# Patient Record
Sex: Male | Born: 1990 | Race: Black or African American | Hispanic: No | Marital: Single | Smoking: Current every day smoker
Health system: Southern US, Community
[De-identification: ages and names within clinical notes are randomized; demographics above are authoritative.]

---
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
## 2009-03-11 ENCOUNTER — Emergency Department (HOSPITAL_COMMUNITY): Admission: EM | Admit: 2009-03-11 | Discharge: 2009-03-11 | Payer: Self-pay | Admitting: Emergency Medicine

## 2012-03-09 ENCOUNTER — Emergency Department (HOSPITAL_COMMUNITY)
Admission: EM | Admit: 2012-03-09 | Discharge: 2012-03-09 | Disposition: A | Discharge status: Home / Self Care | Payer: Self-pay | Attending: Emergency Medicine | Admitting: Emergency Medicine

## 2012-03-09 ENCOUNTER — Encounter (HOSPITAL_COMMUNITY): Payer: Self-pay | Admitting: Emergency Medicine

## 2012-03-09 DIAGNOSIS — L42 Pityriasis rosea: Secondary | ICD-10-CM | POA: Insufficient documentation

## 2012-03-09 DIAGNOSIS — F17200 Nicotine dependence, unspecified, uncomplicated: Secondary | ICD-10-CM | POA: Insufficient documentation

## 2012-03-09 NOTE — ED Notes (Signed)
Pt not in room when RN entered to d/c. EDPA had discussed d/c instructions with pt. Unable to obtain d/c signature. No Rx written for pt.

## 2012-03-09 NOTE — ED Provider Notes (Signed)
History     CSN: [id number]  Arrival date & time 03/09/12  1450   First MD Initiated Contact with Patient 03/09/12 1511      Chief Complaint  Patient presents with  . Rash    (Consider location/radiation/quality/duration/timing/severity/associated sxs/prior treatment) Patient is a 21 y.o. male presenting with rash. The history is provided by the patient.  Rash  This is a new problem. Episode onset: 2 weeks ago. The problem has been gradually worsening. The problem is associated with nothing. There has been no fever. The rash is present on the torso. The patient is experiencing no pain. Associated symptoms include itching. Pertinent negatives include no blisters and no weeping. He has tried antihistamines for the symptoms. The treatment provided mild relief.  Close personal contacts do not have a similar rash.   No past medical history on file.  No past surgical history on file.  No family history on file.  History  Substance Use Topics  . Smoking status: Current Everyday Smoker -- 0.5 packs/day for 5 years    Types: Cigarettes  . Smokeless tobacco: Not on file  . Alcohol Use: Yes     weekends      Review of Systems  Constitutional: Negative for fever and chills.  HENT: Negative for neck stiffness.   Respiratory: Negative for shortness of breath.   Gastrointestinal: Negative for nausea, vomiting and abdominal pain.  Genitourinary: Negative for discharge, genital sores and penile pain.  Musculoskeletal: Negative for joint swelling.  Skin: Positive for itching and rash.  Neurological: Negative for headaches.  Psychiatric/Behavioral: Negative for confusion.    Allergies  Review of patient's allergies indicates no known allergies.  Home Medications   Current Outpatient Rx  Name Route Sig Dispense Refill  . IBUPROFEN 200 MG PO TABS Oral Take 400 mg by mouth every 6 (six) hours as needed. pain      BP 132/77  Pulse 72  Temp(Src) 98.4 F (36.9 C) (Oral)  Resp 16   SpO2 100%  Physical Exam  Nursing note and vitals reviewed. Constitutional: He is oriented to person, place, and time. He appears well-developed and well-nourished. No distress.  HENT:  Head: Normocephalic and atraumatic.  Right Ear: External ear normal.  Left Ear: External ear normal.  Eyes: Pupils are equal, round, and reactive to light.  Neck: Normal range of motion. Neck supple.  Cardiovascular: Normal rate and regular rhythm.   Pulmonary/Chest: Effort normal. No respiratory distress.  Abdominal: Soft. He exhibits no distension. There is no tenderness.  Musculoskeletal: He exhibits no edema and no tenderness.  Neurological: He is alert and oriented to person, place, and time.  Skin: Skin is warm and dry. Rash noted.       Macular, erythematous, ovoid rash with central clearing and slight flaking to edges present to trunk and proximally on all extremities.  Psychiatric: He has a normal mood and affect.    ED Course  Procedures (including critical care time)  Labs Reviewed - No data to display No results found.   Dx 1: Pityriasis rosea   MDM  Rash to trunk, appearance and pt history not c/w secondary syphilis, guttate psoriasis, tinea corporis, tinea versicolor, nummular eczema. Although no specific herald patch can be seen, pt presentation most consistent with pityriasis rosea. Discussed symptomatic tx and UV therapy. Advised dermatology f/u if not improving within 2-3 months or if systemic s/s develop or rash worsens.        Shaaron Adler, [place] 03/09/12 1544

## 2012-03-09 NOTE — Discharge Instructions (Signed)
Use benadryl or zyrtec (which should not make you as drowsy as benadryl) for your itching. You can try sunlight to help the rash improve more quickly. If it does not get better in the time frame we discussed, or if it gets worse or you develop associated fever you should be seen again.    Pityriasis Rosea Pityriasis rosea is a rash which is probably caused by a virus. It generally starts as a scaly, red patch on the trunk (the area of the body that a t-shirt would cover) but does not appear on sun exposed areas. The rash is usually preceded by an initial larger spot called the "herald patch" a week or more before the rest of the rash appears. Generally within one to two days the rash appears rapidly on the trunk, upper arms, and sometimes the upper legs. The rash usually appears as flat, oval patches of scaly pink color. The rash can also be raised and one is able to feel it with a finger. The rash can also be finely crinkled and may slough off leaving a ring of scale around the spot. Sometimes a mild sore throat is present with the rash. It usually affects children and young adults in the spring and autumn. Women are more frequently affected than men. TREATMENT  Pityriasis rosea is a self-limited condition. This means it goes away within 4 to 8 weeks without treatment. The spots may persist for several months, especially in darker-colored skin after the rash has resolved and healed. Benadryl and steroid creams may be used if itching is a problem. SEEK MEDICAL CARE IF:   Your rash does not go away or persists longer than three months.   You develop fever and joint pain.   You develop severe headache and confusion.   You develop breathing difficulty, vomiting and/or extreme weakness.  Document Released: 12/28/2001 Document Revised: 11/10/2011 Document Reviewed: 01/16/2009 [facility] Patient Information 2012 [place], [place].

## 2012-03-09 NOTE — ED Notes (Signed)
Started 2 weeks ago with rash on right flank area. Now has spread all over trunk and starting on arms. States it itches only occasionally.

## 2012-03-10 NOTE — ED Provider Notes (Signed)
Medical screening examination/treatment/procedure(s) were performed by non-physician practitioner and as supervising physician I was immediately available for consultation/collaboration.  Doug Sou, MD 03/10/12 [id number]

## 2014-08-06 ENCOUNTER — Emergency Department (HOSPITAL_COMMUNITY): Payer: Worker's Compensation

## 2014-08-06 ENCOUNTER — Encounter (HOSPITAL_COMMUNITY): Payer: Self-pay | Admitting: Emergency Medicine

## 2014-08-06 ENCOUNTER — Emergency Department (HOSPITAL_COMMUNITY)
Admission: EM | Admit: 2014-08-06 | Discharge: 2014-08-06 | Disposition: A | Discharge status: Home / Self Care | Payer: Worker's Compensation | Attending: Emergency Medicine | Admitting: Emergency Medicine

## 2014-08-06 DIAGNOSIS — F17200 Nicotine dependence, unspecified, uncomplicated: Secondary | ICD-10-CM | POA: Insufficient documentation

## 2014-08-06 DIAGNOSIS — S61412A Laceration without foreign body of left hand, initial encounter: Secondary | ICD-10-CM

## 2014-08-06 DIAGNOSIS — S61209A Unspecified open wound of unspecified finger without damage to nail, initial encounter: Secondary | ICD-10-CM | POA: Insufficient documentation

## 2014-08-06 DIAGNOSIS — Y9389 Activity, other specified: Secondary | ICD-10-CM | POA: Insufficient documentation

## 2014-08-06 DIAGNOSIS — W278XXA Contact with other nonpowered hand tool, initial encounter: Secondary | ICD-10-CM | POA: Insufficient documentation

## 2014-08-06 DIAGNOSIS — Z23 Encounter for immunization: Secondary | ICD-10-CM | POA: Insufficient documentation

## 2014-08-06 DIAGNOSIS — S66922A Laceration of unspecified muscle, fascia and tendon at wrist and hand level, left hand, initial encounter: Secondary | ICD-10-CM

## 2014-08-06 DIAGNOSIS — S61227A Laceration with foreign body of left little finger without damage to nail, initial encounter: Secondary | ICD-10-CM

## 2014-08-06 DIAGNOSIS — Y9289 Other specified places as the place of occurrence of the external cause: Secondary | ICD-10-CM | POA: Insufficient documentation

## 2014-08-06 DIAGNOSIS — S65517A Laceration of blood vessel of left little finger, initial encounter: Secondary | ICD-10-CM

## 2014-08-06 DIAGNOSIS — S61219A Laceration without foreign body of unspecified finger without damage to nail, initial encounter: Secondary | ICD-10-CM

## 2014-08-06 MED ORDER — NAPROXEN 500 MG PO TABS: 500.0000 mg | ORAL_TABLET | Freq: Two times a day (BID) | ORAL | Status: AC | PRN

## 2014-08-06 MED ORDER — LIDOCAINE HCL 2 % IJ SOLN
10.0000 mL | Freq: Once | INTRAMUSCULAR | Status: AC
  Administered 2014-08-06: 200 mg
  Filled 2014-08-06: qty 20

## 2014-08-06 MED ORDER — OXYCODONE-ACETAMINOPHEN 5-325 MG PO TABS
1.0000 | ORAL_TABLET | Freq: Once | ORAL | Status: AC
  Administered 2014-08-06: 1 via ORAL
  Filled 2014-08-06: qty 1

## 2014-08-06 MED ORDER — TETANUS-DIPHTH-ACELL PERTUSSIS 5-2.5-18.5 LF-MCG/0.5 IM SUSP
0.5000 mL | Freq: Once | INTRAMUSCULAR | Status: AC
  Administered 2014-08-06: 0.5 mL via INTRAMUSCULAR
  Filled 2014-08-06: qty 0.5

## 2014-08-06 MED ORDER — OXYCODONE-ACETAMINOPHEN 5-325 MG PO TABS: 1.0000 | ORAL_TABLET | Freq: Four times a day (QID) | ORAL | Status: DC | PRN

## 2014-08-06 MED ORDER — AMOXICILLIN 500 MG PO TABS: 500.0000 mg | ORAL_TABLET | Freq: Two times a day (BID) | ORAL | Status: AC

## 2014-08-06 NOTE — ED Provider Notes (Signed)
CSN: [id number]     Arrival date & time 08/06/14  1409 History  This chart was scribed for non-physician practitioner, Allen Derry, PA-C working with Rolan Bucco, MD by Greggory Stallion, ED scribe. This patient was seen in room WTR7/WTR7 and the patient's care was started at 3:48 PM.   Chief Complaint  Patient presents with  . Extremity Laceration   Patient is a 23 y.o. male presenting with skin laceration. The history is provided by the patient. No language interpreter was used.  Laceration Location:  Hand Hand laceration location:  L hand and L finger Length (cm):  1.5 Depth:  Through underlying tissue Quality: jagged   Bleeding: arterial and controlled with pressure   Time since incident:  2 hours Laceration mechanism:  Metal edge Pain details:    Quality:  Burning (pinching)   Severity:  Severe   Timing:  Constant   Progression:  Unchanged Foreign body present:  Metal Relieved by:  None tried Worsened by:  Nothing tried Ineffective treatments:  None tried Tetanus status:  Out of date  HPI Comments: COWAN PILAR is a 23 y.o. male who presents to the Emergency Department complaining of left pinky finger laceration that occurred around 1:30 PM today. Pt states that his hand slipped while using a band saw to cut metal at work and accidentally cut himself. Reports that blood started squirting out but has been controlled since with pressure. Rates pain 10/10 and states he has numbness around the fingertip. Describes pain as burning and pinching. Worse with movement, with no alleviating factors, didn't take anything for the pain. Denies light headedness, dizziness, tingling in fingers, loss of ROM. Denies history of blood disorders and is not currently taking any blood thinners. Pt's tetanus is out of date.   History reviewed. No pertinent past medical history. History reviewed. No pertinent past surgical history. History reviewed. No pertinent family history. History   Substance Use Topics  . Smoking status: Current Every Day Smoker -- 0.50 packs/day for 5 years    Types: Cigarettes  . Smokeless tobacco: Not on file  . Alcohol Use: Yes     Comment: weekends     Review of Systems  Musculoskeletal: Positive for myalgias (L pinky). Negative for arthralgias and joint swelling.  Skin: Positive for wound.  Neurological: Positive for numbness (L pinky tip). Negative for dizziness and light-headedness.  Hematological: Does not bruise/bleed easily.  10 Systems reviewed and are negative for acute change except as noted in the HPI.  Allergies  Review of patient's allergies indicates no known allergies.  Home Medications   Prior to Admission medications   Medication Sig Start Date End Date Taking? Authorizing Provider  amoxicillin (AMOXIL) 500 MG tablet Take 1 tablet (500 mg total) by mouth 2 (two) times daily. 08/06/14   Keneth Borg Strupp Camprubi-Soms, PA-C  naproxen (NAPROSYN) 500 MG tablet Take 1 tablet (500 mg total) by mouth 2 (two) times daily as needed for mild pain, moderate pain or headache (TAKE WITH MEALS.). 08/06/14   Donnita Falls Camprubi-Soms, PA-C  oxyCODONE-acetaminophen (PERCOCET) 5-325 MG per tablet Take 1-2 tablets by mouth every 6 (six) hours as needed for severe pain. 08/06/14   Jamita Mckelvin Strupp Camprubi-Soms, PA-C   BP 126/79  Pulse 60  Temp(Src) 98.5 F (36.9 C) (Oral)  Resp 18  SpO2 100%  Physical Exam  Nursing note and vitals reviewed. Constitutional: He is oriented to person, place, and time. Vital signs are normal. He appears well-developed and well-nourished. No distress.  VSS, visibly uncomfortable but pleasant and cooperative  HENT:  Head: Normocephalic and atraumatic.  Mouth/Throat: Mucous membranes are normal.  Eyes: Conjunctivae and EOM are normal.  Neck: Normal range of motion. Neck supple.  Cardiovascular: Normal rate and intact distal pulses.   Intact distal pulses, arterial bleeding noted to L pinky laceration which  is controlled with pressure. Fingertip with good cap refill  Pulmonary/Chest: Effort normal. No respiratory distress.  Abdominal: Normal appearance. He exhibits no distension.  Musculoskeletal:       Left hand: He exhibits decreased range of motion (secondary to pain and some loss of DIP flexion), tenderness, disruption of two-point discrimination (at L pinky tip) and laceration. He exhibits no bony tenderness, normal capillary refill, no deformity and no swelling. Decreased sensation (L pinky tip) noted. Normal strength noted.       Hands: L pinky with laceration across middle phalanx with arterial bleeding noted, controlled with pressure. ROM limited at DIP joint with flexion, but full extension noted. Loss of 2point discrimination at fingertip. TTP to area of lac. Cap refill intact. Strength in L wrist/hand intact.   Neurological: He is alert and oriented to person, place, and time. He has normal strength. A sensory deficit (as above) is present.  Skin: Skin is warm and dry. Laceration noted.  1.5cm jagged lac to L pinky in middle phalanx segment, bleeding from skin edge, arterial, but controlled with pressure.   Psychiatric: He has a normal mood and affect. His behavior is normal.    ED Course  LACERATION REPAIR Date/Time: 08/06/2014 4:16 PM Performed by: Marjean Donna, Fallon Haecker STRUPP Authorized by: Ramond Marrow Consent: Verbal consent obtained. Risks and benefits: risks, benefits and alternatives were discussed Consent given by: patient Patient understanding: patient states understanding of the procedure being performed Patient consent: the patient's understanding of the procedure matches consent given Patient identity confirmed: verbally with patient Body area: upper extremity Location details: left small finger Laceration length: 1.5 cm Contamination: The wound is contaminated. Foreign bodies: metal Tendon involvement: complex Nerve involvement: complex Vascular  damage: yes Anesthesia: local infiltration Local anesthetic: lidocaine 2% without epinephrine Anesthetic total: 1.5 ml Patient sedated: no Preparation: Patient was prepped and draped in the usual sterile fashion. Irrigation solution: saline Irrigation method: syringe Amount of cleaning: extensive Debridement: none Degree of undermining: none Skin closure: 5-0 Prolene Number of sutures: 2 Technique: simple Approximation: close Approximation difficulty: complex Dressing: 4x4 sterile gauze Patient tolerance: Patient tolerated the procedure well with no immediate complications. Comments: Controlled arterial bleeding but oozing still from edges. FB attempted to be removed, sutured closed to re-xray and see if it was removed. Did not fully close wound at this time  FOREIGN BODY REMOVAL Date/Time: 08/06/2014 5:45 PM Performed by: Marjean Donna, Akylah Hascall STRUPP Authorized by: Ramond Marrow Consent: Verbal consent obtained. Risks and benefits: risks, benefits and alternatives were discussed Consent given by: patient Patient understanding: patient states understanding of the procedure being performed Patient consent: the patient's understanding of the procedure matches consent given Patient identity confirmed: verbally with patient Body area: skin General location: upper extremity Location details: left small finger Anesthesia method: anesthesia as previously. Patient sedated: no Patient restrained: no Patient cooperative: yes Localization method: probed Removal mechanism: forceps and irrigation Dressing: dressing applied Tendon involvement: complex Depth: deep Complexity: complex 0 objects recovered. Post-procedure assessment: foreign body not removed Patient tolerance: Patient tolerated the procedure well with no immediate complications. Comments: Attempted to irrigate and locate FB, unable to visualize. Noted tendon involvement. LACERATION REPAIR  Date/Time:  08/06/2014 6:45 PM Performed by: Marjean Donna, Hendrick Pavich STRUPP Authorized by: Ramond Marrow Consent: Verbal consent obtained. Risks and benefits: risks, benefits and alternatives were discussed Consent given by: patient Patient understanding: patient states understanding of the procedure being performed Patient consent: the patient's understanding of the procedure matches consent given Patient identity confirmed: verbally with patient Body area: upper extremity Location details: left small finger Laceration length: 1.5 cm Contamination: The wound is contaminated. Foreign bodies: metal Tendon involvement: complex Nerve involvement: complex Vascular damage: yes Anesthesia method: as before. Patient sedated: no Preparation: Patient was prepped and draped in the usual sterile fashion. Irrigation solution: saline Irrigation method: syringe Amount of cleaning: extensive Debridement: none Degree of undermining: none Skin closure: 5-0 Prolene Number of sutures: 2 Technique: simple Approximation: close Approximation difficulty: simple Dressing: pressure dressing and splint Patient tolerance: Patient tolerated the procedure well with no immediate complications. Comments: Sutured closed to with FB retained but arterial bleeding controlled after wound seal and compression. Will f/up with hand surgeon for further repair    DIAGNOSTIC STUDIES: Oxygen Saturation is 100% on RA, normal by my interpretation.    COORDINATION OF CARE: 3:51 PM-Discussed treatment plan which includes percocet, laceration repair and updating tetanus with pt at bedside and pt agreed to plan.   5:54 PM-Tried to get other foreign body out of finger and was unsuccessful. After further exploration of wound, there was tendon involvement. Will consult hand.   6:45 PM- sutured closed in order to have hand surgeon operate at a later date.   Labs Review Labs Reviewed - No data to display  Imaging  Review Dg Finger Little Left  08/06/2014   CLINICAL DATA:  Recent laceration  EXAM: LEFT LITTLE FINGER 2+V  COMPARISON:  Film from earlier in the same day  FINDINGS: The previously seen dressings have been removed. Again, no bony abnormality is seen. There remain multiple small radiopaque densities within the soft tissues consistent with foreign bodies.  IMPRESSION: Multiple retained foreign bodies.  No bony abnormality is noted.   Electronically Signed   By: Alcide Clever M.D.   On: 08/06/2014 17:20   Dg Finger Little Left  08/06/2014   CLINICAL DATA:  Recent laceration  EXAM: LEFT LITTLE FINGER 2+V  COMPARISON:  None.  FINDINGS: Considerable soft tissue irregularity is noted related to overlying dressings as well as the recent injury. Multiple small radiopaque foreign bodies are noted within the soft tissues likely related to the recent injury. No definitive bony abnormality is seen.  IMPRESSION: Soft tissue injury with apparent foreign bodies. No bony abnormality is seen.   Electronically Signed   By: Alcide Clever M.D.   On: 08/06/2014 14:52     EKG Interpretation None      MDM   Final diagnoses:  Laceration of left little finger with foreign body w/o damage to nail, initial encounter  Laceration of blood vessel of left little finger, initial encounter  Laceration of hand with tendon involvement including fingers, left, initial encounter    23y/o male with L pinky lac. Arterial bleeding controlled after pressure and wound seal. Neurovasc damage as well as tendon damage noted. Xray with +FB, FB could not be recovered after several attempts, re-imaging demonstrated continued retained FB. Bleeding controlled after pressure and wound seal, although would re-bleed with removal of pressure. Hand surgeon Dr. Melvyn Novas consulted and visualized wound with Haiku image. Stated to suture area closed, with FB still retained, as well as with tendons unrepaired, and place pressure  dressing with splint. Will see  him on Friday. Discussed this with pt, who agreed. Repaired with 2 sutures and bleeding continued to ooze but no longer squirting. Pt stable throughout stay in the ED. Given pain meds x2 with good results. rx given for pain meds, and amoxicillin to help prevent infection. Would've liked to give Augmentin but due to cost concern, pt stated he'd rather go with amox although he understood the risks. Will be seeing Dr Melvyn Novas Friday in the office. Tetanus updated. I explained the diagnosis and have given explicit precautions to return to the ER including for any other new or worsening symptoms. The patient understands and accepts the medical plan as it's been dictated and I have answered their questions. Discharge instructions concerning home care and prescriptions have been given. The patient is STABLE and is discharged to home in good condition.   I personally performed the services described in this documentation, which was scribed in my presence. The recorded information has been reviewed and is accurate.  BP 132/84  Pulse 61  Temp(Src) 98.5 F (36.9 C) (Oral)  Resp 16  SpO2 98%  Meds ordered this encounter  Medications  . Tdap (BOOSTRIX) injection 0.5 mL    Sig:   . oxyCODONE-acetaminophen (PERCOCET/ROXICET) 5-325 MG per tablet 1 tablet    Sig:   . lidocaine (XYLOCAINE) 2 % (with pres) injection 200 mg    Sig:   . oxyCODONE-acetaminophen (PERCOCET/ROXICET) 5-325 MG per tablet 1 tablet    Sig:   . amoxicillin (AMOXIL) 500 MG tablet    Sig: Take 1 tablet (500 mg total) by mouth 2 (two) times daily.    Dispense:  14 tablet    Refill:  0    Order Specific Question:  Supervising Provider    Answer:  Eber Hong D [[id number]]  . oxyCODONE-acetaminophen (PERCOCET) 5-325 MG per tablet    Sig: Take 1-2 tablets by mouth every 6 (six) hours as needed for severe pain.    Dispense:  20 tablet    Refill:  0    Order Specific Question:  Supervising Provider    Answer:  Eber Hong D [[id number]]  .  naproxen (NAPROSYN) 500 MG tablet    Sig: Take 1 tablet (500 mg total) by mouth 2 (two) times daily as needed for mild pain, moderate pain or headache (TAKE WITH MEALS.).    Dispense:  20 tablet    Refill:  0    Order Specific Question:  Supervising Provider    Answer:  Vida Roller [address] [place] Latissa Frick Camprubi-Soms, PA-C 08/06/14 2028

## 2014-08-06 NOTE — ED Provider Notes (Signed)
     Rolan Bucco, MD 08/06/14 [phone]

## 2014-08-06 NOTE — Discharge Instructions (Signed)
Use the splint at all times aside from when you're doing the dressing changes twice daily. Wash your wound with soap and water twice daily and rebandage with compression if bleeding, and place the splint back on. Take antibiotic as directed. Use naprosyn and percocet as needed for pain, don't drive or operate machinery while taking this. See Dr. Melvyn Novas on Friday, call his office tomorrow morning and make the appointment. Return to the ER for changes or worsening symptoms.  . Laceration Care, Adult A laceration is a cut that goes through all layers of the skin. The cut goes into the tissue beneath the skin. HOME CARE For stitches (sutures) or staples:  Keep the cut clean and dry.  If you have a bandage (dressing), change it at least once a day. Change the bandage if it gets wet or dirty, or as told by your doctor.  Wash the cut with soap and water 2 times a day. Rinse the cut with water. Pat it dry with a clean towel.  Put a thin layer of medicated cream on the cut as told by your doctor.  You may shower after the first 24 hours. Do not soak the cut in water until the stitches are removed.  Only take medicines as told by your doctor.  Have your stitches or staples removed as told by your doctor. For skin adhesive strips:  Keep the cut clean and dry.  Do not get the strips wet. You may take a bath, but be careful to keep the cut dry.  If the cut gets wet, pat it dry with a clean towel.  The strips will fall off on their own. Do not remove the strips that are still stuck to the cut. For wound glue:  You may shower or take baths. Do not soak or scrub the cut. Do not swim. Avoid heavy sweating until the glue falls off on its own. After a shower or bath, pat the cut dry with a clean towel.  Do not put medicine on your cut until the glue falls off.  If you have a bandage, do not put tape over the glue.  Avoid lots of sunlight or tanning lamps until the glue falls off. Put sunscreen on  the cut for the first year to reduce your scar.  The glue will fall off on its own. Do not pick at the glue. You may need a tetanus shot if:  You cannot remember when you had your last tetanus shot.  You have never had a tetanus shot. If you need a tetanus shot and you choose not to have one, you may get tetanus. Sickness from tetanus can be serious. GET HELP RIGHT AWAY IF:   Your pain does not get better with medicine.  Your arm, hand, leg, or foot loses feeling (numbness) or changes color.  Your cut is bleeding.  Your joint feels weak, or you cannot use your joint.  You have painful lumps on your body.  Your cut is red, puffy (swollen), or painful.  You have a red line on the skin near the cut.  You have yellowish-white fluid (pus) coming from the cut.  You have a fever.  You have a bad smell coming from the cut or bandage.  Your cut breaks open before or after stitches are removed.  You notice something coming out of the cut, such as wood or glass.  You cannot move a finger or toe. MAKE SURE YOU:   Understand these instructions.  Will watch your condition.  Will get help right away if you are not doing well or get worse. Document Released: 05/09/2008 Document Revised: 02/13/2012 Document Reviewed: 05/17/2011 [facility] Patient Information 2015 [place], [place]. This information is not intended to replace advice given to you by your health care provider. Make sure you discuss any questions you have with your health care provider.

## 2014-08-06 NOTE — ED Notes (Signed)
Pt states that he cut his finger on a band saw 1 hr ago. Lt pinky finger.  Bleeding controlled.

## 2014-08-07 NOTE — ED Provider Notes (Signed)
Medical screening examination/treatment/procedure(s) were performed by non-physician practitioner and as supervising physician I was immediately available for consultation/collaboration.   EKG Interpretation None        Rolan Bucco, MD 08/07/14 [phone]

## 2014-08-12 MED ORDER — CEFAZOLIN SODIUM-DEXTROSE 2-3 GM-% IV SOLR
2.0000 g | INTRAVENOUS | Status: AC
  Administered 2014-08-13: 2 g via INTRAVENOUS
  Filled 2014-08-12: qty 50

## 2014-08-12 NOTE — Progress Notes (Signed)
No pre-op orders in EPIC, called Dr. Ortmann's office and left message requesting orders. 

## 2014-08-12 NOTE — Progress Notes (Signed)
Unable to reach pt by phone. Contacted his sister, Ramond Craver, she was at work and unable to talk. Called her back and left pre-op instructions on her VM and she will relay them to her brother.

## 2014-08-13 ENCOUNTER — Ambulatory Visit (HOSPITAL_COMMUNITY): Payer: Worker's Compensation | Admitting: Anesthesiology

## 2014-08-13 ENCOUNTER — Encounter (HOSPITAL_COMMUNITY): Payer: Self-pay | Admitting: *Deleted

## 2014-08-13 ENCOUNTER — Encounter (HOSPITAL_COMMUNITY)
Admission: RE | Disposition: A | Discharge status: Home / Self Care | Payer: Self-pay | Source: Ambulatory Visit | Attending: Orthopedic Surgery

## 2014-08-13 ENCOUNTER — Ambulatory Visit (HOSPITAL_COMMUNITY)
Admission: RE | Admit: 2014-08-13 | Discharge: 2014-08-13 | Disposition: A | Discharge status: Home / Self Care | Payer: Worker's Compensation | Source: Ambulatory Visit | Attending: Orthopedic Surgery | Admitting: Orthopedic Surgery

## 2014-08-13 ENCOUNTER — Encounter (HOSPITAL_COMMUNITY): Payer: Worker's Compensation | Admitting: Anesthesiology

## 2014-08-13 DIAGNOSIS — T148XXA Other injury of unspecified body region, initial encounter: Secondary | ICD-10-CM | POA: Diagnosis not present

## 2014-08-13 DIAGNOSIS — X58XXXA Exposure to other specified factors, initial encounter: Secondary | ICD-10-CM | POA: Diagnosis not present

## 2014-08-13 DIAGNOSIS — F17200 Nicotine dependence, unspecified, uncomplicated: Secondary | ICD-10-CM | POA: Insufficient documentation

## 2014-08-13 DIAGNOSIS — S61209A Unspecified open wound of unspecified finger without damage to nail, initial encounter: Secondary | ICD-10-CM | POA: Insufficient documentation

## 2014-08-13 HISTORY — PX: LACERATION REPAIR: SHX5284

## 2014-08-13 SURGERY — REPAIR, LACERATION, 2 OR MORE
Anesthesia: General | Site: Finger | Laterality: Left

## 2014-08-13 MED ORDER — ONDANSETRON HCL 4 MG/2ML IJ SOLN
INTRAMUSCULAR | Status: AC
  Filled 2014-08-13: qty 2

## 2014-08-13 MED ORDER — BUPIVACAINE HCL (PF) 0.25 % IJ SOLN
INTRAMUSCULAR | Status: AC
  Filled 2014-08-13: qty 30

## 2014-08-13 MED ORDER — ROCURONIUM BROMIDE 50 MG/5ML IV SOLN
INTRAVENOUS | Status: AC
  Filled 2014-08-13: qty 1

## 2014-08-13 MED ORDER — MIDAZOLAM HCL 2 MG/2ML IJ SOLN
INTRAMUSCULAR | Status: AC
  Filled 2014-08-13: qty 2

## 2014-08-13 MED ORDER — HYDROMORPHONE HCL PF 1 MG/ML IJ SOLN
0.2500 mg | INTRAMUSCULAR | Status: DC | PRN
  Administered 2014-08-13 (×4): 0.5 mg via INTRAVENOUS

## 2014-08-13 MED ORDER — PROMETHAZINE HCL 25 MG/ML IJ SOLN: 6.2500 mg | INTRAMUSCULAR | Status: DC | PRN

## 2014-08-13 MED ORDER — BUPIVACAINE HCL 0.25 % IJ SOLN
INTRAMUSCULAR | Status: DC | PRN
  Administered 2014-08-13: 5 mL

## 2014-08-13 MED ORDER — HYDROMORPHONE HCL PF 1 MG/ML IJ SOLN
INTRAMUSCULAR | Status: AC
  Filled 2014-08-13: qty 1

## 2014-08-13 MED ORDER — LACTATED RINGERS IV SOLN
INTRAVENOUS | Status: DC
  Administered 2014-08-13: 13:00:00 via INTRAVENOUS

## 2014-08-13 MED ORDER — NEOSTIGMINE METHYLSULFATE 10 MG/10ML IV SOLN
INTRAVENOUS | Status: AC
  Filled 2014-08-13: qty 1

## 2014-08-13 MED ORDER — ARTIFICIAL TEARS OP OINT
TOPICAL_OINTMENT | OPHTHALMIC | Status: AC
  Filled 2014-08-13: qty 3.5

## 2014-08-13 MED ORDER — MIDAZOLAM HCL 5 MG/5ML IJ SOLN
INTRAMUSCULAR | Status: DC | PRN
  Administered 2014-08-13: 2 mg via INTRAVENOUS

## 2014-08-13 MED ORDER — FENTANYL CITRATE 0.05 MG/ML IJ SOLN
INTRAMUSCULAR | Status: AC
  Filled 2014-08-13: qty 5

## 2014-08-13 MED ORDER — OXYCODONE-ACETAMINOPHEN 5-325 MG PO TABS: 1.0000 | ORAL_TABLET | Freq: Four times a day (QID) | ORAL | Status: AC | PRN

## 2014-08-13 MED ORDER — CHLORHEXIDINE GLUCONATE 4 % EX LIQD: 60.0000 mL | Freq: Once | CUTANEOUS | Status: DC

## 2014-08-13 MED ORDER — GLYCOPYRROLATE 0.2 MG/ML IJ SOLN
INTRAMUSCULAR | Status: AC
  Filled 2014-08-13: qty 2

## 2014-08-13 MED ORDER — PROPOFOL 10 MG/ML IV BOLUS
INTRAVENOUS | Status: DC | PRN
  Administered 2014-08-13: 200 mg via INTRAVENOUS

## 2014-08-13 MED ORDER — PROPOFOL 10 MG/ML IV BOLUS
INTRAVENOUS | Status: AC
  Filled 2014-08-13: qty 20

## 2014-08-13 MED ORDER — FENTANYL CITRATE 0.05 MG/ML IJ SOLN
INTRAMUSCULAR | Status: DC | PRN
  Administered 2014-08-13: 50 ug via INTRAVENOUS
  Administered 2014-08-13: 100 ug via INTRAVENOUS

## 2014-08-13 MED ORDER — LIDOCAINE HCL (CARDIAC) 20 MG/ML IV SOLN
INTRAVENOUS | Status: DC | PRN
  Administered 2014-08-13: 60 mg via INTRAVENOUS

## 2014-08-13 MED ORDER — ONDANSETRON HCL 4 MG/2ML IJ SOLN
INTRAMUSCULAR | Status: DC | PRN
  Administered 2014-08-13: 4 mg via INTRAVENOUS

## 2014-08-13 SURGICAL SUPPLY — 48 items
BANDAGE ELASTIC 3 VELCRO ST LF (GAUZE/BANDAGES/DRESSINGS) ×3 IMPLANT
BANDAGE ELASTIC 4 VELCRO ST LF (GAUZE/BANDAGES/DRESSINGS) ×3 IMPLANT
BLADE SURG 15 STRL LF DISP TIS (BLADE) IMPLANT
BLADE SURG 15 STRL SS (BLADE)
BNDG CMPR 9X4 STRL LF SNTH (GAUZE/BANDAGES/DRESSINGS) ×1
BNDG COHESIVE 1X5 TAN STRL LF (GAUZE/BANDAGES/DRESSINGS) ×2 IMPLANT
BNDG CONFORM 2 STRL LF (GAUZE/BANDAGES/DRESSINGS) ×3 IMPLANT
BNDG ESMARK 4X9 LF (GAUZE/BANDAGES/DRESSINGS) ×3 IMPLANT
BNDG GAUZE ELAST 4 BULKY (GAUZE/BANDAGES/DRESSINGS) ×3 IMPLANT
CORDS BIPOLAR (ELECTRODE) ×3 IMPLANT
COVER MAYO STAND STRL (DRAPES) ×3 IMPLANT
CUFF TOURNIQUET SINGLE 18IN (TOURNIQUET CUFF) IMPLANT
DRAPE SURG 17X23 STRL (DRAPES) ×3 IMPLANT
DRSG ADAPTIC 3X8 NADH LF (GAUZE/BANDAGES/DRESSINGS) ×2 IMPLANT
DRSG EMULSION OIL 3X3 NADH (GAUZE/BANDAGES/DRESSINGS) ×3 IMPLANT
GAUZE SPONGE 4X4 12PLY STRL (GAUZE/BANDAGES/DRESSINGS) ×3 IMPLANT
GLOVE BIOGEL PI IND STRL 8.5 (GLOVE) ×1 IMPLANT
GLOVE BIOGEL PI INDICATOR 8.5 (GLOVE) ×2
GLOVE SURG ORTHO 8.0 STRL STRW (GLOVE) ×3 IMPLANT
GOWN STRL REUS W/ TWL XL LVL3 (GOWN DISPOSABLE) ×1 IMPLANT
GOWN STRL REUS W/TWL 2XL LVL3 (GOWN DISPOSABLE) ×3 IMPLANT
GOWN STRL REUS W/TWL XL LVL3 (GOWN DISPOSABLE) ×3
KIT BASIN OR (CUSTOM PROCEDURE TRAY) ×3 IMPLANT
LOOP VESSEL MAXI BLUE (MISCELLANEOUS) IMPLANT
LOOP VESSEL MINI RED (MISCELLANEOUS) IMPLANT
NEEDLE HYPO 25X1 1.5 SAFETY (NEEDLE) IMPLANT
NS IRRIG 1000ML POUR BTL (IV SOLUTION) ×3 IMPLANT
PACK ORTHO EXTREMITY (CUSTOM PROCEDURE TRAY) ×3 IMPLANT
PAD CAST 4YDX4 CTTN HI CHSV (CAST SUPPLIES) ×2 IMPLANT
PADDING CAST ABS 4INX4YD NS (CAST SUPPLIES) ×2
PADDING CAST ABS COTTON 4X4 ST (CAST SUPPLIES) ×1 IMPLANT
PADDING CAST COTTON 4X4 STRL (CAST SUPPLIES) ×6
SPEAR EYE SURG WECK-CEL (MISCELLANEOUS) IMPLANT
SUCTION FRAZIER TIP 10 FR DISP (SUCTIONS) IMPLANT
SUT ETHIBOND 3-0 V-5 (SUTURE) IMPLANT
SUT ETHILON 4 0 PS 2 18 (SUTURE) IMPLANT
SUT ETHILON 5 0 PS 2 18 (SUTURE) ×2 IMPLANT
SUT MERSILENE 4 0 P 3 (SUTURE) IMPLANT
SUT PROLENE 4 0 PS 2 18 (SUTURE) IMPLANT
SUT VIC AB 2-0 SH 27 (SUTURE)
SUT VIC AB 2-0 SH 27XBRD (SUTURE) IMPLANT
SUT VICRYL 4-0 PS2 18IN ABS (SUTURE) ×3 IMPLANT
SUT VICRYL RAPIDE 4/0 PS 2 (SUTURE) ×3 IMPLANT
SYR CONTROL 10ML LL (SYRINGE) IMPLANT
TUBE CONNECTING 12'X1/4 (SUCTIONS)
TUBE CONNECTING 12X1/4 (SUCTIONS) IMPLANT
UNDERPAD 30X30 INCONTINENT (UNDERPADS AND DIAPERS) ×3 IMPLANT
WATER STERILE IRR 1000ML POUR (IV SOLUTION) ×3 IMPLANT

## 2014-08-13 NOTE — H&P (Signed)
Dennis Monroe is an 23 y.o. male.   Chief Complaint: Left small finger injury HPI:Pt seen/evaluated in office Pt with laceration to left small finger Seen in ED, referred to my office Here to continue care and for surgery on left small finger No prior surgery to left hand  History reviewed. No pertinent past medical history.  History reviewed. No pertinent past surgical history.  History reviewed. No pertinent family history. Social History:  reports that he has been smoking Cigarettes.  He has a 2.5 pack-year smoking history. He does not have any smokeless tobacco history on file. He reports that he drinks alcohol. His drug history is not on file.  Allergies: No Known Allergies  Medications Prior to Admission  Medication Sig Dispense Refill  . amoxicillin (AMOXIL) 500 MG tablet Take 1 tablet (500 mg total) by mouth 2 (two) times daily.  14 tablet  0  . naproxen (NAPROSYN) 500 MG tablet Take 1 tablet (500 mg total) by mouth 2 (two) times daily as needed for mild pain, moderate pain or headache (TAKE WITH MEALS.).  20 tablet  0  . oxyCODONE-acetaminophen (PERCOCET) 5-325 MG per tablet Take 1-2 tablets by mouth every 6 (six) hours as needed for severe pain.  20 tablet  0    No results found for this or any previous visit (from the past 48 hour(s)). No results found.  ROSNO RECENT ILLNESSES OR HOSPITALIZATIONS  Blood pressure 136/71, pulse 71, temperature 98.6 F (37 C), temperature source Oral, resp. rate 18, height  (1.727 m), weight 86.183 kg (190 lb), SpO2 99.00%. Physical Exam  General Appearance:  Alert, cooperative, no distress, appears stated age  Head:  Normocephalic, without obvious abnormality, atraumatic  Eyes:  Pupils equal, conjunctiva/corneas clear,         Throat: Lips, mucosa, and tongue normal; teeth and gums normal  Neck: No visible masses     Lungs:   respirations unlabored  Chest Wall:  No tenderness or deformity  Heart:  Regular rate and rhythm,   Abdomen:   Soft, non-tender,         Extremities: LEFT SMALL FINGER IN BANDAGE, FINGER TIP WARM WELL PERFUSED LIMITED MOVMENT IN DRESSING NO WOUNDS TO LONG/RING/INDEX FINGER TIPS WARM WELL PERFUSED  Pulses: 2+ and symmetric  Skin: Skin color, texture, turgor normal, no rashes or lesions     Neurologic: Normal    Assessment/Plan LEFT SMALL FINGER LACERATION WITH TENDON AND NERVE INVOLVEMENT  LEFT SMALL FINGER OPEN WOUND EXPLORATION AND REPAIR AS INDICATED  R/B/A DISCUSSED WITH PT IN OFFICE.  PT VOICED UNDERSTANDING OF PLAN CONSENT SIGNED DAY OF SURGERY PT SEEN AND EXAMINED PRIOR TO OPERATIVE PROCEDURE/DAY OF SURGERY SITE MARKED. QUESTIONS ANSWERED WILL GO HOME FOLLOWING SURGERY RISKS INCLUDE NOT LIMITED TO BLEEDING,INFECTION, NERVE,ARTERY, TENDON DAMAGE, NONUNION,MALUNION, HARDWARE FAILURE, LOSS OF MOTION OF FINGERS,WRIST,FOREARM,ELBOW AND NEED FOR FURTHER SURGERY. WE ARE PLANNING SURGERY FOR YOUR UPPER EXTREMITY. THE RISKS AND BENEFITS OF SURGERY INCLUDE BUT NOT LIMITED TO BLEEDING INFECTION, DAMAGE TO NEARBY NERVES ARTERIES TENDONS, FAILURE OF SURGERY TO ACCOMPLISH ITS INTENDED GOALS, PERSISTENT SYMPTOMS AND NEED FOR FURTHER SURGICAL INTERVENTION. WITH THIS IN MIND WE WILL PROCEED. I HAVE DISCUSSED WITH THE PATIENT THE PRE AND POSTOPERATIVE REGIMEN AND THE DOS AND DON'TS. PT VOICED UNDERSTANDING AND INFORMED CONSENT SIGNED. Sharma Covert 08/13/2014, 1:11 PM

## 2014-08-13 NOTE — Transfer of Care (Signed)
Immediate Anesthesia Transfer of Care Note  Patient: Dennis Monroe  Procedure(s) Performed: Procedure(s): LEFT SMALL FINGER LACERATION EXPLORATION WITH REPAIR AS NECESSARY (Left)  Patient Location: PACU  Anesthesia Type:General  Level of Consciousness: awake and alert   Airway & Oxygen Therapy: Patient Spontanous Breathing and Patient connected to nasal cannula oxygen  Post-op Assessment: Report given to PACU RN, Post -op Vital signs reviewed and stable and Patient moving all extremities  Post vital signs: Reviewed and stable  Complications: No apparent anesthesia complications

## 2014-08-13 NOTE — Anesthesia Postprocedure Evaluation (Signed)
  Anesthesia Post-op Note  Patient: Dennis Monroe  Procedure(s) Performed: Procedure(s): LEFT SMALL FINGER LACERATION EXPLORATION WITH REPAIR AS NECESSARY (Left)  Patient Location: PACU  Anesthesia Type:General  Level of Consciousness: awake, alert  and oriented  Airway and Oxygen Therapy: Patient Spontanous Breathing  Post-op Pain: mild  Post-op Assessment: Post-op Vital signs reviewed  Post-op Vital Signs: Reviewed  Last Vitals:  Filed Vitals:   08/13/14 1724  BP: 131/74  Pulse: 66  Temp:   Resp: 15    Complications: No apparent anesthesia complications

## 2014-08-13 NOTE — Brief Op Note (Signed)
08/13/2014  1:13 PM  PATIENT:  Dennis Monroe  23 y.o. male  PRE-OPERATIVE DIAGNOSIS:  left small finger laceraton with tendon involvement  POST-OPERATIVE DIAGNOSIS:  * No post-op diagnosis entered *  PROCEDURE:  Procedure(s): LEFT SMALL FINGER LACERATION EXPLORATION WITH REPAIR AS NECESSARY (Left)  SURGEON:  Surgeon(s) and Role:    * Sharma Covert, MD - Primary  PHYSICIAN ASSISTANT:   ASSISTANTS: none   ANESTHESIA:   general  EBL:     BLOOD ADMINISTERED:none  DRAINS: none   LOCAL MEDICATIONS USED:  MARCAINE     SPECIMEN:  No Specimen  DISPOSITION OF SPECIMEN:  N/A  COUNTS:  YES  TOURNIQUET:    DICTATION: .Other Dictation: Dictation Number [id number]  PLAN OF CARE: Discharge to home after PACU  PATIENT DISPOSITION:  PACU - hemodynamically stable.   Delay start of Pharmacological VTE agent (>24hrs) due to surgical blood loss or risk of bleeding: not applicable

## 2014-08-13 NOTE — Discharge Instructions (Signed)
KEEP BANDAGE CLEAN AND DRY CALL OFFICE FOR F/U APPT [phone] in 13 days KEEP HAND ELEVATED ABOVE HEART OK TO APPLY ICE TO OPERATIVE AREA CONTACT OFFICE IF ANY WORSENING PAIN OR CONCERNS.  Laceration Care, Adult    A laceration is a cut that goes through all layers of the skin. The cut goes into the tissue beneath the skin.  HOME CARE  For stitches (sutures) or staples:  Keep the cut clean and dry.  If you have a bandage (dressing), change it at least once a day. Change the bandage if it gets wet or dirty, or as told by your doctor.  Wash the cut with soap and water 2 times a day. Rinse the cut with water. Pat it dry with a clean towel.  Put a thin layer of medicated cream on the cut as told by your doctor.  You may shower after the first 24 hours. Do not soak the cut in water until the stitches are removed.  Only take medicines as told by your doctor.  Have your stitches or staples removed as told by your doctor. For skin adhesive strips:  Keep the cut clean and dry.  Do not get the strips wet. You may take a bath, but be careful to keep the cut dry.  If the cut gets wet, pat it dry with a clean towel.  The strips will fall off on their own. Do not remove the strips that are still stuck to the cut. For wound glue:  You may shower or take baths. Do not soak or scrub the cut. Do not swim. Avoid heavy sweating until the glue falls off on its own. After a shower or bath, pat the cut dry with a clean towel.  Do not put medicine on your cut until the glue falls off.  If you have a bandage, do not put tape over the glue.  Avoid lots of sunlight or tanning lamps until the glue falls off. Put sunscreen on the cut for the first year to reduce your scar.  The glue will fall off on its own. Do not pick at the glue. You may need a tetanus shot if:  You cannot remember when you had your last tetanus shot.  You have never had a tetanus shot. If you need a tetanus shot and you choose not to have  one, you may get tetanus. Sickness from tetanus can be serious.  GET HELP RIGHT AWAY IF:  Your pain does not get better with medicine.  Your arm, hand, leg, or foot loses feeling (numbness) or changes color.  Your cut is bleeding.  Your joint feels weak, or you cannot use your joint.  You have painful lumps on your body.  Your cut is red, puffy (swollen), or painful.  You have a red line on the skin near the cut.  You have yellowish-white fluid (pus) coming from the cut.  You have a fever.  You have a bad smell coming from the cut or bandage.  Your cut breaks open before or after stitches are removed.  You notice something coming out of the cut, such as wood or glass.  You cannot move a finger or toe. MAKE SURE YOU:  Understand these instructions.  Will watch your condition.  Will get help right away if you are not doing well or get worse. Document Released: 05/09/2008 Document Revised: 02/13/2012 Document Reviewed: 05/17/2011  [facility] Patient Information 2014 [place], [place].     What to eat:  For your first meals, you should eat lightly; only small meals initially.  If you do not have nausea, you may eat larger meals.  Avoid spicy, greasy and heavy food.    General Anesthesia, Adult, Care After  Refer to this sheet in the next few weeks. These instructions provide you with information on caring for yourself after your procedure. Your health care provider may also give you more specific instructions. Your treatment has been planned according to current medical practices, but problems sometimes occur. Call your health care provider if you have any problems or questions after your procedure.  WHAT TO EXPECT AFTER THE PROCEDURE  After the procedure, it is typical to experience:  Sleepiness.  Nausea and vomiting. HOME CARE INSTRUCTIONS  For the first 24 hours after general anesthesia:  Have a responsible person with you.  Do not drive a car. If you are alone, do not take public  transportation.  Do not drink alcohol.  Do not take medicine that has not been prescribed by your health care provider.  Do not sign important papers or make important decisions.  You may resume a normal diet and activities as directed by your health care provider.  Change bandages (dressings) as directed.  If you have questions or problems that seem related to general anesthesia, call the hospital and ask for the anesthetist or anesthesiologist on call. SEEK MEDICAL CARE IF:  You have nausea and vomiting that continue the day after anesthesia.  You develop a rash. SEEK IMMEDIATE MEDICAL CARE IF:  You have difficulty breathing.  You have chest pain.  You have any allergic problems. Document Released: 02/27/2001 Document Revised: 07/24/2013 Document Reviewed: 06/06/2013  [facility] Patient Information 2014 [place], [place].

## 2014-08-13 NOTE — Anesthesia Preprocedure Evaluation (Addendum)
Anesthesia Evaluation  Patient identified by MRN, date of birth, ID band Patient awake    Reviewed: Allergy & Precautions, H&P , NPO status , Patient's Chart, lab work & pertinent test results  Airway Mallampati: III TM Distance: >3 FB Neck ROM: Full    Dental  (+) Teeth Intact, Dental Advisory Given   Pulmonary Current Smoker,  breath sounds clear to auscultation        Cardiovascular negative cardio ROS  Rhythm:Regular Rate:Normal     Neuro/Psych negative neurological ROS  negative psych ROS   GI/Hepatic negative GI ROS, Neg liver ROS,   Endo/Other  negative endocrine ROS  Renal/GU negative Renal ROS     Musculoskeletal negative musculoskeletal ROS (+)   Abdominal   Peds  Hematology negative hematology ROS (+)   Anesthesia Other Findings   Reproductive/Obstetrics negative OB ROS                          Anesthesia Physical Anesthesia Plan  ASA: I  Anesthesia Plan: General   Post-op Pain Management:    Induction: Intravenous  Airway Management Planned: LMA  Additional Equipment: None  Intra-op Plan:   Post-operative Plan: Extubation in OR  Informed Consent: I have reviewed the patients History and Physical, chart, labs and discussed the procedure including the risks, benefits and alternatives for the proposed anesthesia with the patient or authorized representative who has indicated his/her understanding and acceptance.   Dental advisory given  Plan Discussed with: CRNA, Anesthesiologist and Surgeon  Anesthesia Plan Comments:         Anesthesia Quick Evaluation

## 2014-08-14 ENCOUNTER — Encounter (HOSPITAL_COMMUNITY): Payer: Self-pay | Admitting: Orthopedic Surgery

## 2014-08-14 NOTE — Op Note (Signed)
NAMECAELEN, REIERSON NO.:  [id number]  MEDICAL RECORD NO.:  [id number]  LOCATION:  [facility]                         FACILITY:  [facility]  PHYSICIAN:  Madelynn Done, MD  DATE OF BIRTH:  [birth year]  DATE OF PROCEDURE:  08/13/2014 DATE OF DISCHARGE:  08/13/2014                              OPERATIVE REPORT   PREOPERATIVE DIAGNOSIS:  Left small finger laceration with nerve involvement.  POSTOPERATIVE DIAGNOSIS:  Left small finger laceration with nerve involvement.  ATTENDING PHYSICIAN:  Madelynn Done, MD, who scrubbed and present for the entire procedure.  ASSISTANT SURGEON:  None.  ANESTHESIA:  General via LMA.  TOURNIQUET TIME:  Less than 30 minutes at 250 mmHg.  SURGICAL PROCEDURE: 1. Left small finger __________ radial digital nerve. 2. Left small finger flexor tendon sheath exploration and drainage.  SURGICAL INDICATION:  Mr. Mcquitty is a  23 year old gentleman who sustained a closed injury and an open injury to his left volar aspect of the small finger.  The patient presented to the office with the nerve deficits, concern for flexor tendon injury. The patient was seen, evaluated in the office and recommended to undergo the above procedure. Risks, benefits, and alternatives were discussed in detail with the patient and signed informed consent was obtained.  Risks include, but not limited to bleeding, infection, damage to nearby nerves, arteries, or tendons, loss of motion to the wrist and digits, incomplete relief of symptoms, and in need for further surgical intervention.  DESCRIPTION OF PROCEDURE:  The patient was properly identified in the preoperative holding area and marked with a permanent marker made on left small finger to indicate the correct operative site.  The patient was then brought back to the operating room, placed supine on the anesthesia table.  General anesthesia was administered.  The patient tolerated this well.  A well-padded  tourniquet was then placed on left brachium and sealed with 1000 drape.  The left upper extremity was then prepped and draped in normal sterile fashion.  Time-out was called. Correct site was identified, and procedure then begun.  Attention then turned to left small finger where a previous laceration was then extended both proximally and distally.  Dissection __________ then elevated and tourniquet insufflated.  Dissection was carried out through the skin and subcutaneous tissue.  The radial digital nerve was __________ with a complete laceration through this.  The flexor sheath was then exposed and then thoroughly irrigated.  The patient had a laceration through the A5 pulley.  The FDP was in continuity distally. The flexor sheath was then opened up and drained.  Following this, under loupe magnification, the radial digital nerve was then carefully repaired with 9-0 nylon, 3 epineural sutures with good reapproximation of the nerve ends.  The wound was then thoroughly irrigated.  The traumatic laceration extended. Wound was then closed with a 4-0 Vicryl repeat.  A 5 mL of 0.25% Marcaine infiltrated locally.  Adaptic dressing, sterile compressive bandage were applied.  The patient tolerated the procedure well, returned to the recovery room in good condition after being placed in a small finger splint.  POSTPROCEDURE PLAN:  The patient is discharged to home,  seen back in the office in approximately 2 weeks for wound check, suture removal, and begin gradual motion activity,  __________ splint.  No radiographs at the first visit.     Madelynn Done, MD     FWO/MEDQ  D:  08/13/2014  T:  08/13/2014  Job:  [id number]

## 2018-02-08 ENCOUNTER — Emergency Department (HOSPITAL_COMMUNITY): Payer: Self-pay

## 2018-02-08 ENCOUNTER — Emergency Department (HOSPITAL_COMMUNITY)
Admission: EM | Admit: 2018-02-08 | Discharge: 2018-02-08 | Disposition: A | Discharge status: Home / Self Care | Payer: Self-pay | Attending: Emergency Medicine | Admitting: Emergency Medicine

## 2018-02-08 ENCOUNTER — Encounter (HOSPITAL_COMMUNITY): Payer: Self-pay | Admitting: Emergency Medicine

## 2018-02-08 DIAGNOSIS — Y999 Unspecified external cause status: Secondary | ICD-10-CM | POA: Insufficient documentation

## 2018-02-08 DIAGNOSIS — Y939 Activity, unspecified: Secondary | ICD-10-CM | POA: Insufficient documentation

## 2018-02-08 DIAGNOSIS — Y929 Unspecified place or not applicable: Secondary | ICD-10-CM | POA: Insufficient documentation

## 2018-02-08 DIAGNOSIS — G8911 Acute pain due to trauma: Secondary | ICD-10-CM | POA: Insufficient documentation

## 2018-02-08 DIAGNOSIS — M25532 Pain in left wrist: Secondary | ICD-10-CM | POA: Insufficient documentation

## 2018-02-08 DIAGNOSIS — W1830XA Fall on same level, unspecified, initial encounter: Secondary | ICD-10-CM | POA: Insufficient documentation

## 2018-02-08 MED ORDER — ACETAMINOPHEN 325 MG PO TABS: 650.0000 mg | ORAL_TABLET | Freq: Four times a day (QID) | ORAL | 0 refills | Status: AC | PRN

## 2018-02-08 MED ORDER — IBUPROFEN 400 MG PO TABS: 400.0000 mg | ORAL_TABLET | Freq: Four times a day (QID) | ORAL | 0 refills | Status: AC | PRN

## 2018-02-08 NOTE — ED Triage Notes (Signed)
Patient reports that he fell 2 weeks ago and caught himself and been having left wrist pain since.

## 2018-02-08 NOTE — ED Notes (Signed)
Bed: [facility] Expected date:  Expected time:  Means of arrival:  Comments: 

## 2018-02-08 NOTE — ED Provider Notes (Signed)
[facility]-EMERGENCY DEPT Provider Note   CSN: [id number] Arrival date & time: 02/08/18  1049     History   Chief Complaint Chief Complaint  Patient presents with  . Wrist Pain    HPI Dennis Monroe is a 27 y.o. male.  HPI   Pt is a 27 y/o male presenting to the ED c/o constant left stabbing wrist pain that began after he fell on an outstretched hand 2 weeks ago. Pain is not severe when he is not moving his hand, however when he bends his wrist he has 7/10 stabbing pain to the palmar aspect of the wrist as well as proximal thumb.  Patient denies any other pain to the hand.  Denies any new numbness or weakness to the hand.  Does have chronic numbness to the left fifth finger s/p surgery in distant past, that is unchanged today.  Denies any swelling or redness.  Has tried taking ibuprofen for his pain.  Denies any other injuries, denies any head trauma or loss of consciousness.  History reviewed. No pertinent past medical history.  There are no active problems to display for this patient.   Past Surgical History:  Procedure Laterality Date  . LACERATION REPAIR Left 08/13/2014   Procedure: LEFT SMALL FINGER LACERATION EXPLORATION WITH REPAIR AS NECESSARY;  Surgeon: Sharma CovertFred W Ortmann, MD;  Location: [facility] OR;  Service: Orthopedics;  Laterality: Left;       Home Medications    Prior to Admission medications   Medication Sig Start Date End Date Taking? Authorizing Provider  amoxicillin (AMOXIL) 500 MG tablet Take 1 tablet (500 mg total) by mouth 2 (two) times daily. 08/06/14   Street, [place], PA-C  naproxen (NAPROSYN) 500 MG tablet Take 1 tablet (500 mg total) by mouth 2 (two) times daily as needed for mild pain, moderate pain or headache (TAKE WITH MEALS.). 08/06/14   Street, [place], PA-C  oxyCODONE-acetaminophen (PERCOCET) 5-325 MG per tablet Take 1-2 tablets by mouth every 6 (six) hours as needed for severe pain. 08/13/14   Bradly Bienenstockrtmann, Fred, MD    Family History No  family history on file.  Social History Social History   Tobacco Use  . Smoking status: Current Every Day Smoker    Packs/day: 0.50    Years: 5.00    Pack years: 2.50    Types: Cigarettes  . Smokeless tobacco: Never Used  Substance Use Topics  . Alcohol use: Yes    Comment: weekends  . Drug use: Yes    Types: Marijuana     Allergies   Patient has no known allergies.   Review of Systems Review of Systems  Musculoskeletal:       Left wrist pain  Neurological: Negative for weakness and numbness.       No head trauma or loc     Physical Exam Updated Vital Signs BP 137/69 (BP Location: Right Arm)   Pulse 71   Temp 97.6 F (36.4 C) (Oral)   Resp 16   SpO2 98%   Physical Exam  Constitutional: He is oriented to person, place, and time. He appears well-developed and well-nourished. No distress.  Eyes: Conjunctivae are normal.  Cardiovascular: Normal rate.  Pulmonary/Chest: Effort normal.  Musculoskeletal:  Patient has tenderness to palpation across the ventral surface of the wrist.  Overlying skin changes.  Radial and ulnar pulses intact.  Brisk cap refill in all fingers.  Normal sensation in all fingers, with exception of left fifth digit has decreased sensation which  is chronic per patient and unchanged.  Patient has 5/5 grip strength, thumb flexion, pincer strength, finger abduction, wrist extension/flexion.  Has complete range of motion of the wrist.  No snuffbox tenderness.  Neurological: He is alert and oriented to person, place, and time.  Skin: Skin is warm and dry.     ED Treatments / Results  Labs (all labs ordered are listed, but only abnormal results are displayed) Labs Reviewed - No data to display  EKG  EKG Interpretation None       Radiology Dg Wrist Complete Left  Result Date: 02/08/2018 CLINICAL DATA:  Left wrist pain since fall 2 weeks ago. EXAM: LEFT WRIST - COMPLETE 3+ VIEW COMPARISON:  None. FINDINGS: There is no evidence of fracture or  dislocation. There is no evidence of arthropathy or other focal bone abnormality. Soft tissues are unremarkable. IMPRESSION: Negative. Electronically Signed   By: Obie Dredge M.D.   On: 02/08/2018 11:16    Procedures Procedures (including critical care time) SPLINT APPLICATION Date/Time: 1:24 PM Authorized by: Karrie Meres Consent: Verbal consent obtained. Risks and benefits: risks, benefits and alternatives were discussed Consent given by: patient Splint applied by: orthopedic technician Location details: left wrist Splint type: thumb spica Supplies used: thumb spica Post-procedure: The splinted body part was neurovascularly unchanged following the procedure. Patient tolerance: Patient tolerated the procedure well with no immediate complications.   Medications Ordered in ED Medications - No data to display   Initial Impression / Assessment and Plan / ED Course  I have reviewed the triage vital signs and the nursing notes.  Pertinent labs & imaging results that were available during my care of the patient were reviewed by me and considered in my medical decision making (see chart for details).    Final Clinical Impressions(s) / ED Diagnoses   Final diagnoses:  Left wrist pain   27 year old man coming in with left wrist pain.  Negative left wrist x-ray.  No snuffbox tenderness.  Will fit with a thumb spica and treat symptomatically.  Gave patient OrthO follow-up, advised repeat wrist x-ray in 7-10 days to rule out occult fracture.  Return precautions discussed.  Patient agrees to follow-up as directed.  ED Discharge Orders    None       Rayne Du 02/08/18 1325    Mancel Bale, MD 02/09/18 1208

## 2018-02-08 NOTE — Discharge Instructions (Signed)
You may alternate taking Tylenol and Ibuprofen as needed for pain control. You may take 400-600 mg of ibuprofen every 6 hours and 500-1000 mg of Tylenol every 6 hours. Do not exceed 4000 mg of Tylenol daily as this can lead to liver damage. Also, make sure to take Ibuprofen with meals as it can cause an upset stomach. Do not take other NSAIDs while taking Ibuprofen such as (Aleve, Naprosyn, Aspirin, Celebrex, etc) and do not take more than the prescribed dose as this can lead to ulcers and bleeding in your GI tract. You may use warm and cold compresses to help with your symptoms.   Please follow up with the orthopedic doctor within the next 7-10 days for re-evaluation and further treatment of your symptoms.   Please return to the ER sooner if you have any new or worsening symptoms.  

## 2018-12-06 IMAGING — CR DG WRIST COMPLETE 3+V*L*
4 series · 4 of 4 positions shown · non-contrast
Comparison: None.

CLINICAL DATA: Left wrist pain since fall 2 weeks ago.

EXAM:
LEFT WRIST - COMPLETE 3+ VIEW

[x wrist pa left]
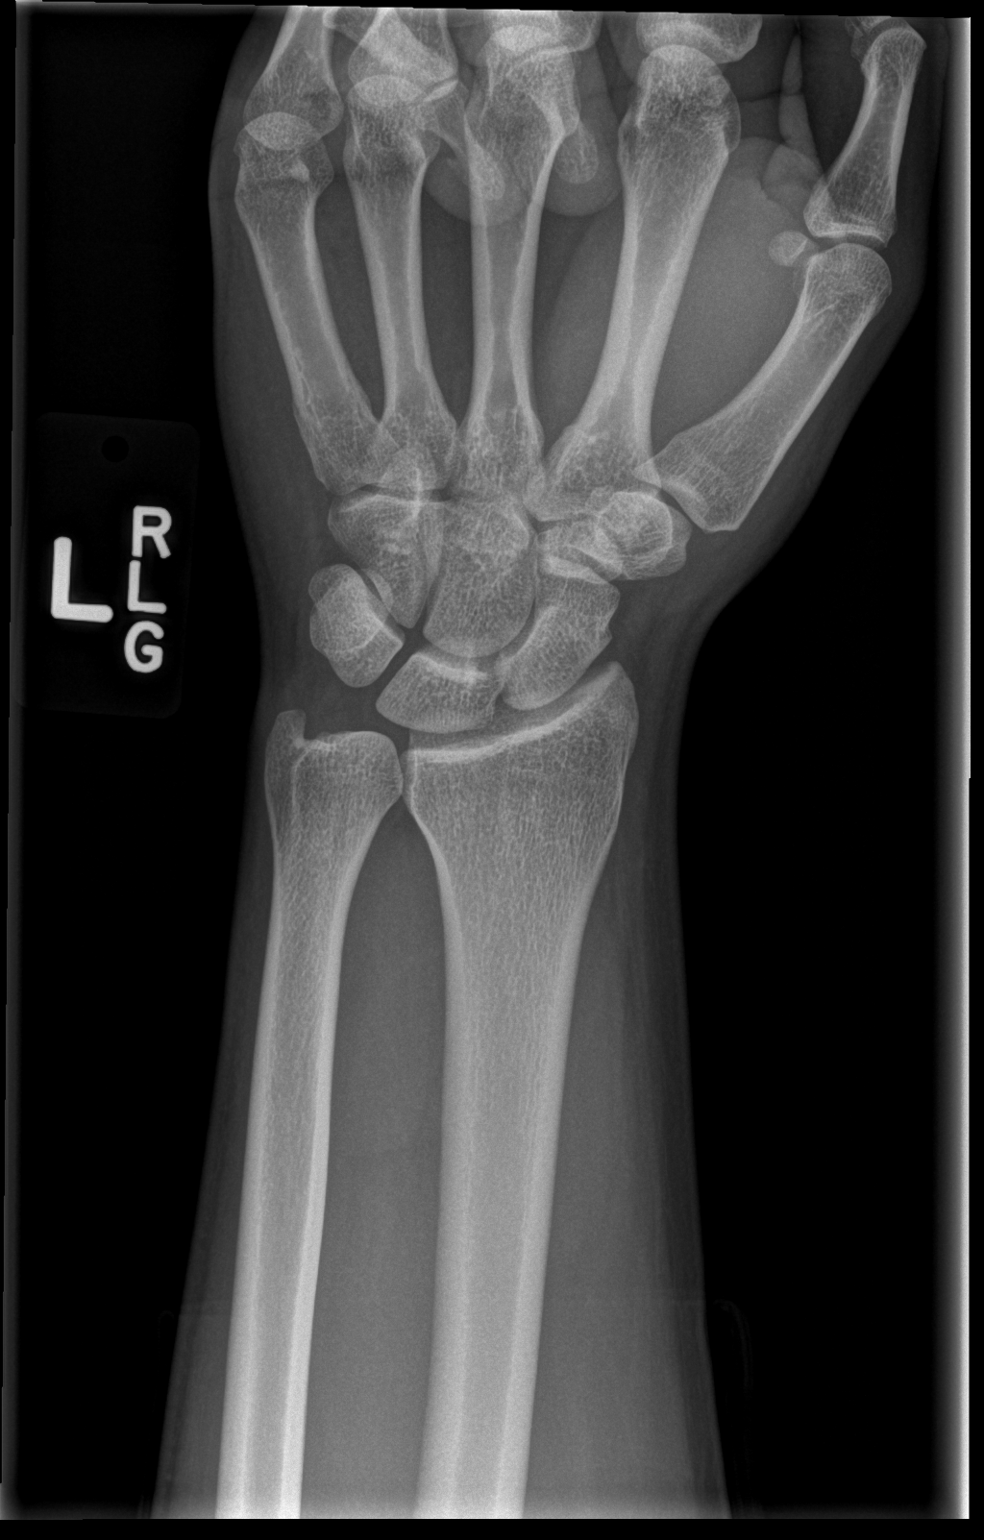

[x wrist obl left]
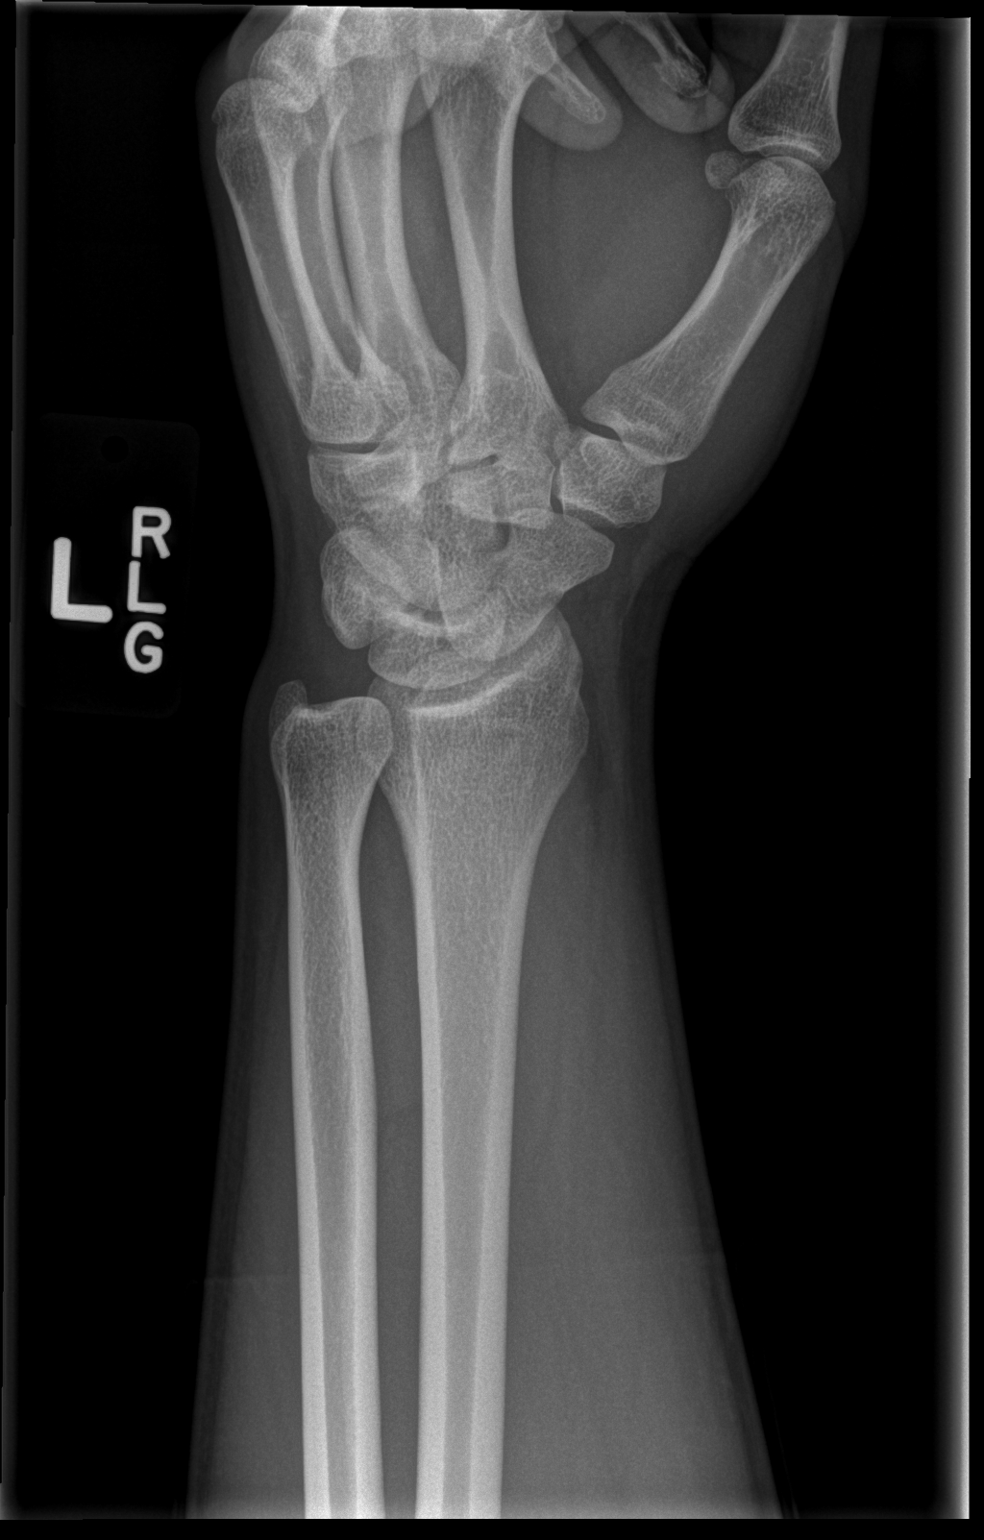

[x wrist lat left]
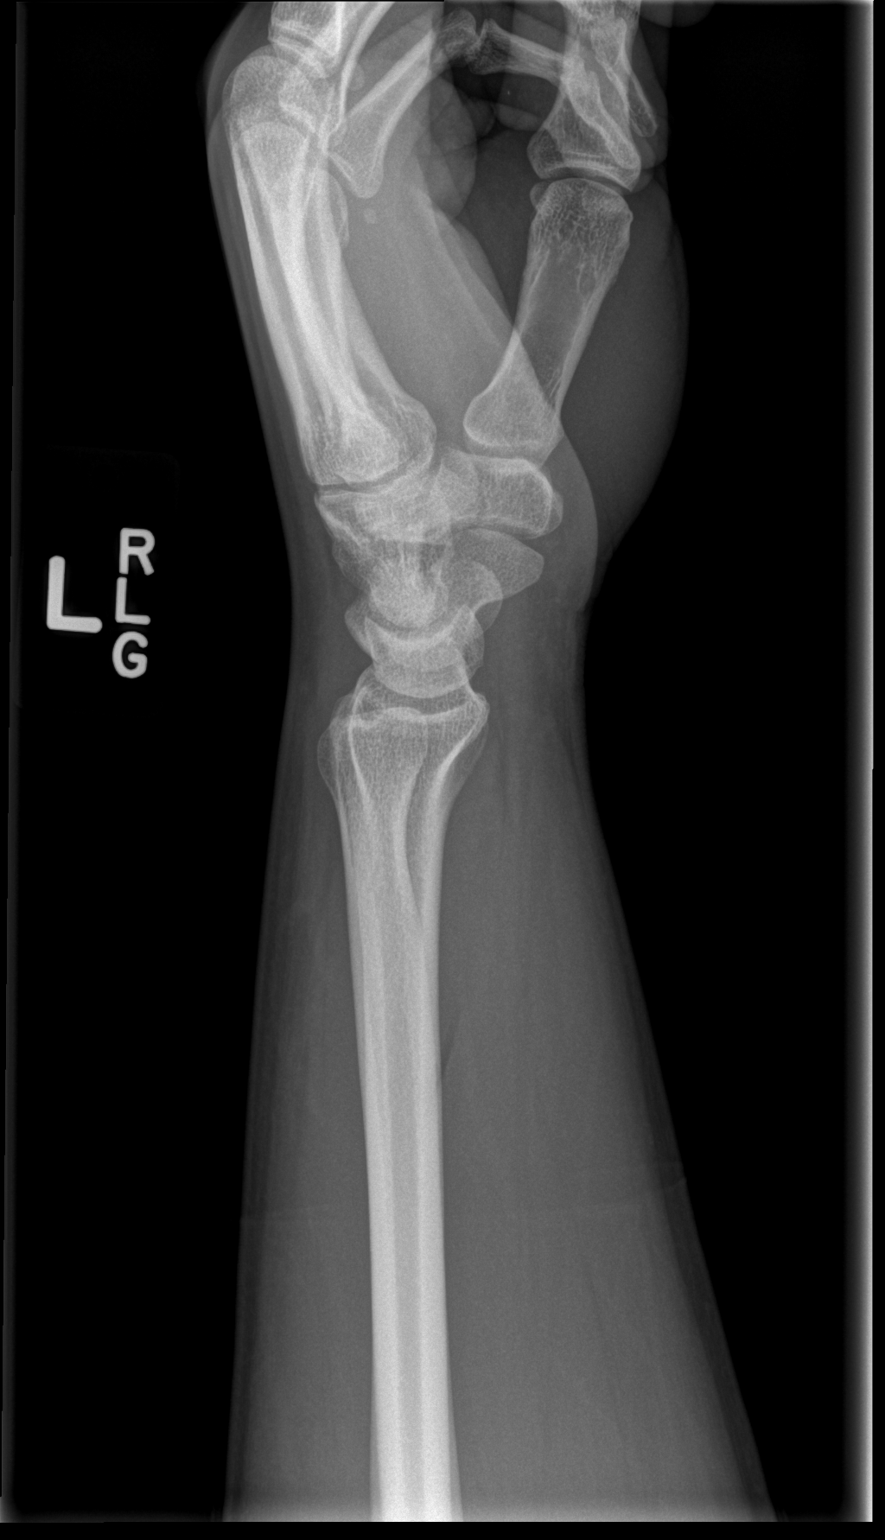

[x wrist navicular view left]
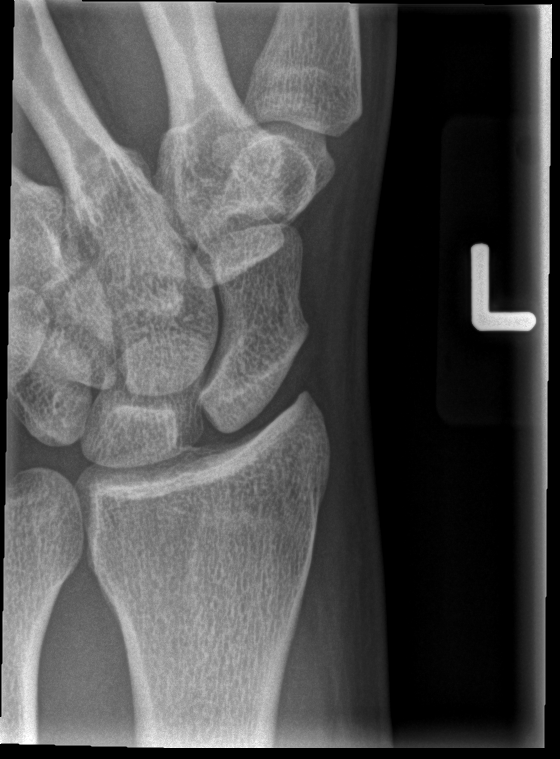

[4 of 4 positions shown; findings below may reference images not displayed]

FINDINGS: There is no evidence of fracture or dislocation. There is no
evidence of arthropathy or other focal bone abnormality. Soft
tissues are unremarkable.
IMPRESSION: Negative.
